# Patient Record
Sex: Male | Born: 1985 | Race: White | Hispanic: No | Marital: Single | Smoking: Current every day smoker
Health system: Southern US, Community
[De-identification: ages and names within clinical notes are randomized; demographics above are authoritative.]

---

## 2007-10-22 ENCOUNTER — Emergency Department: Payer: Self-pay | Admitting: Emergency Medicine

## 2009-12-25 ENCOUNTER — Emergency Department: Payer: Self-pay | Admitting: Emergency Medicine

## 2010-01-04 ENCOUNTER — Emergency Department: Payer: Self-pay | Admitting: Internal Medicine

## 2011-09-03 IMAGING — CT CT STONE STUDY
1 of 2 series · 16 of 32 positions shown, 20 images · non-contrast
Comparison: none

REASON FOR EXAM: hematuria, back pain
COMMENTS:

[Series 2: stone · axial · 0.85mm/px · z∈[-562,-100]mm · 16 of 168 slices shown, 20 images]
[im 7/168  soft-tissue]
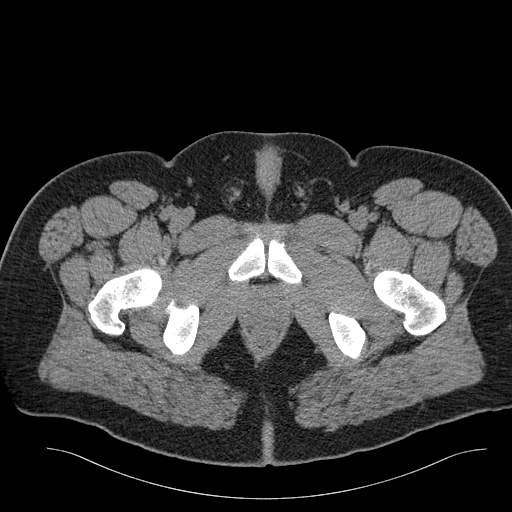
[im 7/168  bone]
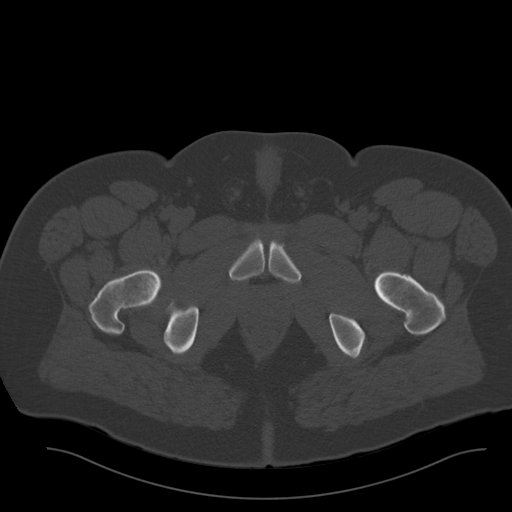
[im 21/168  soft-tissue]
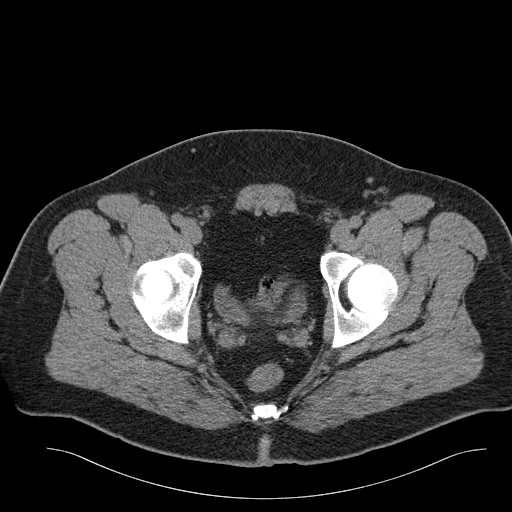
[im 35/168  soft-tissue]
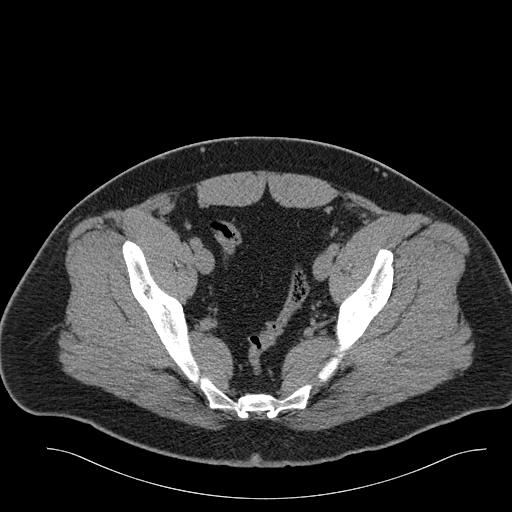
[im 42/168  soft-tissue]
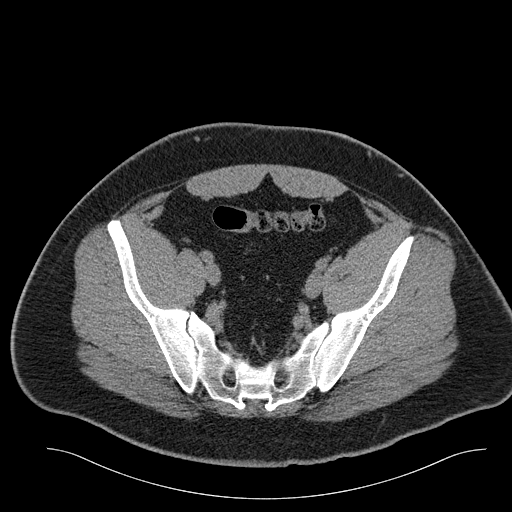
[im 56/168  soft-tissue]
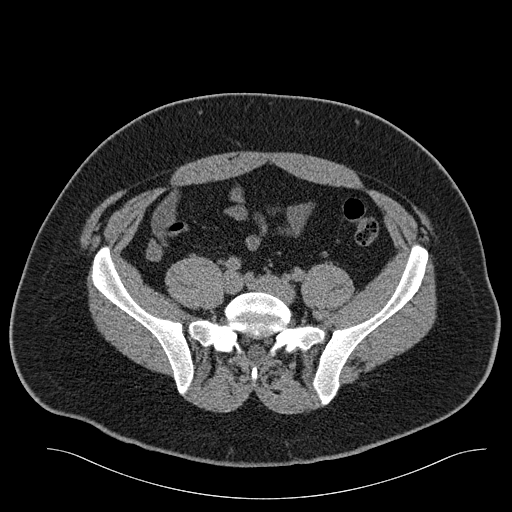
[im 70/168  soft-tissue]
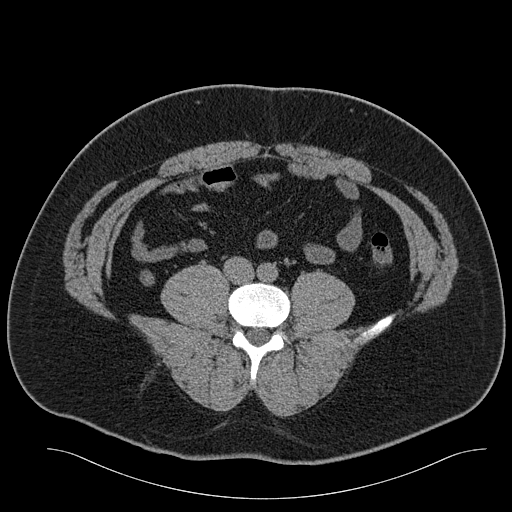
[im 77/168  soft-tissue]
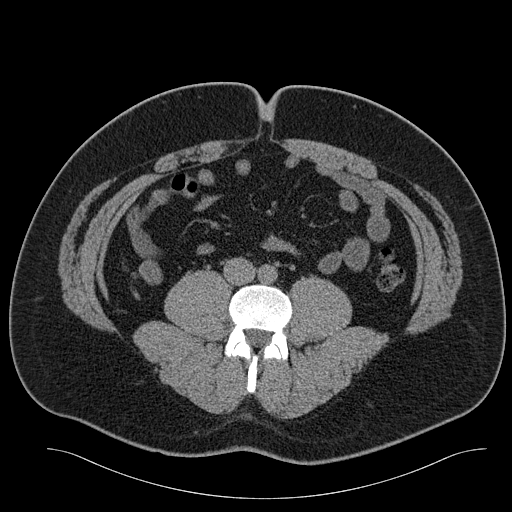
[im 91/168  soft-tissue]
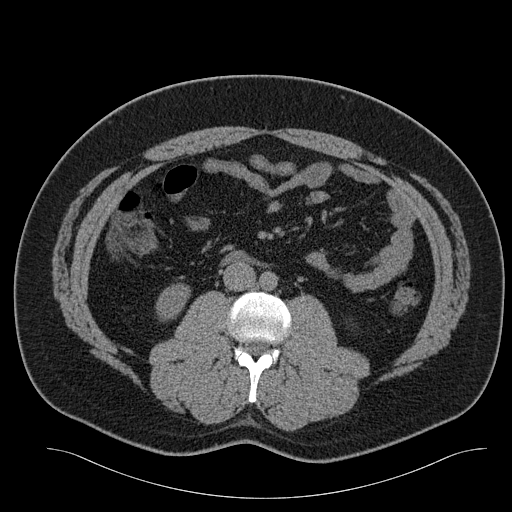
[im 98/168  soft-tissue]
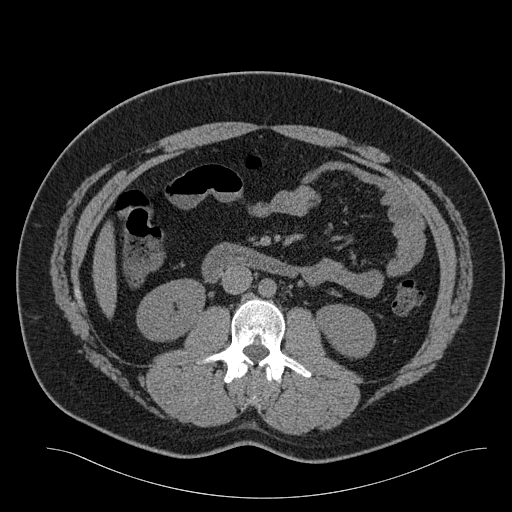
[im 98/168  bone]
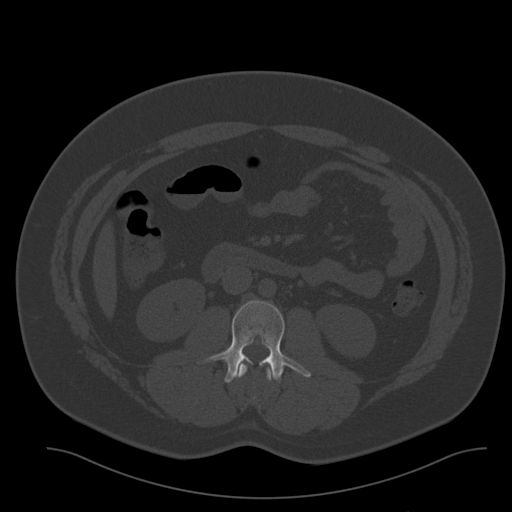
[im 112/168  soft-tissue]
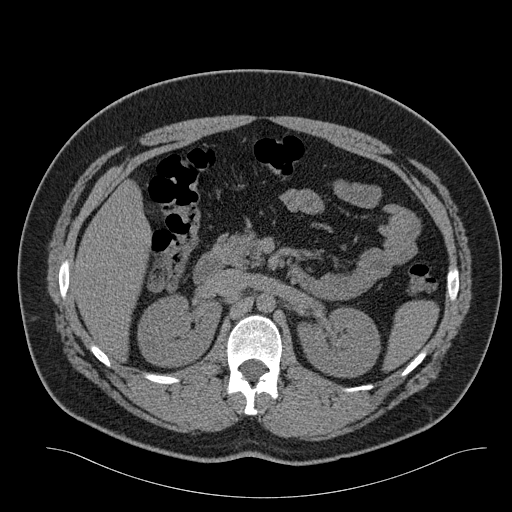
[im 126/168  soft-tissue]
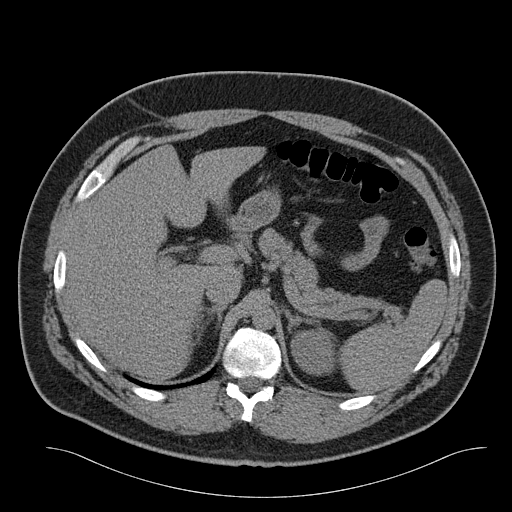
[im 133/168  soft-tissue]
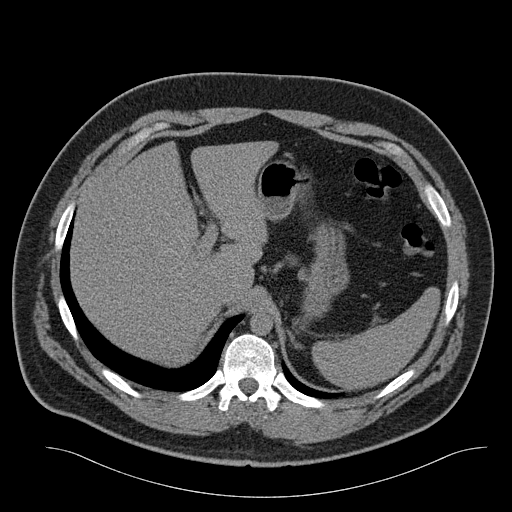
[im 140/168  lung]
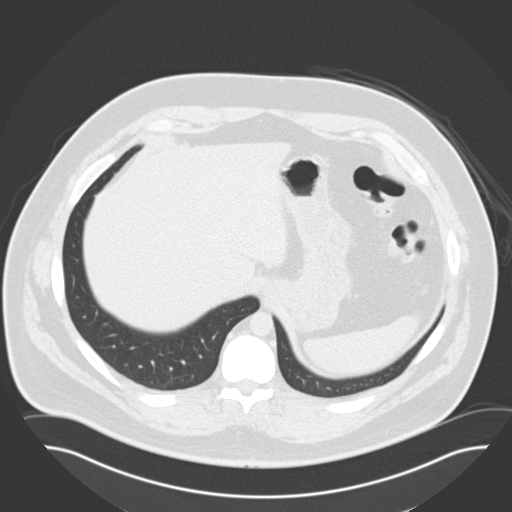
[im 147/168  soft-tissue]
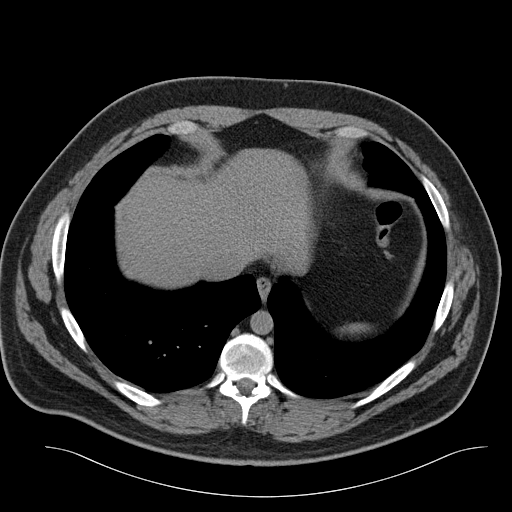
[im 147/168  lung]
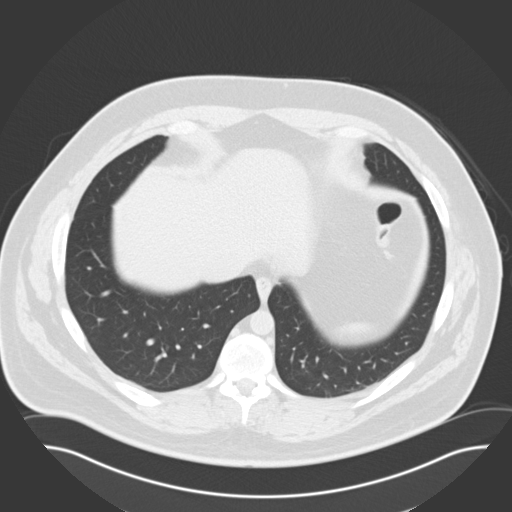
[im 154/168  lung]
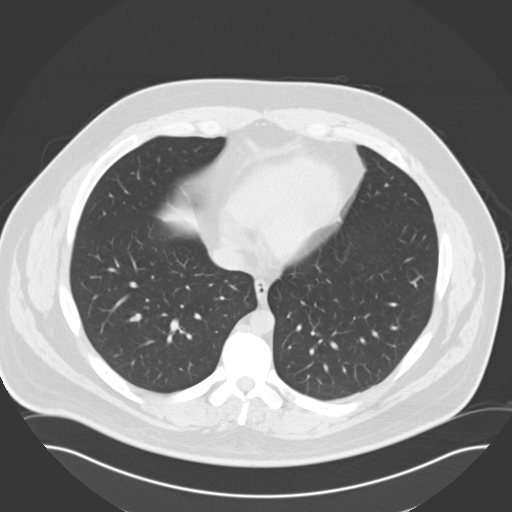
[im 161/168  soft-tissue]
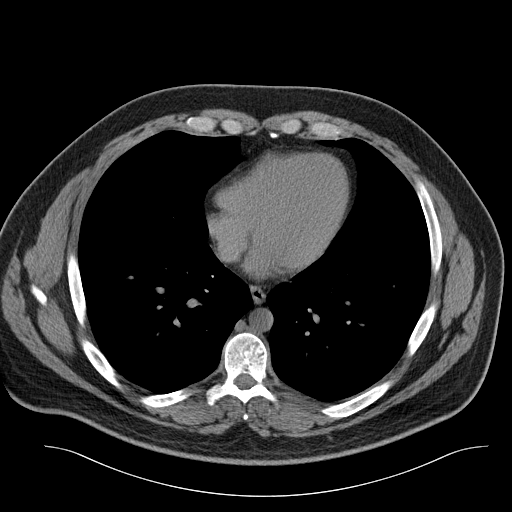
[im 161/168  lung]
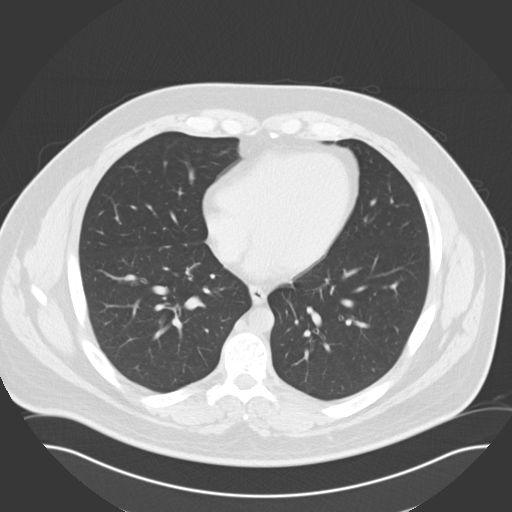

[16 of 32 positions shown; findings below may reference images not displayed]

PROCEDURE:     CT  - CT ABDOMEN /PELVIS WO (STONE)  - December 25, 2009  [DATE]

RESULT:     Renal stone protocol CT of the abdomen and pelvis is performed
in the standard fashion and reconstructed in the axial plane at 3 mm slice
thickness. There is no previous exam for comparison.

No renal calculi or obstructive changes are evident. The aorta is normal in
caliber. The appendix is normal. There is no acute inflammation. The upper
abdominal viscera appear normal. There is no free fluid, free air, bowel
wall thickening or abnormal bowel distention. The prostate and urinary
bladder appear unremarkable. Small phleboliths are seen in the pelvic region.
IMPRESSION: 1. Unremarkable CT of the abdomen and pelvis.

## 2014-05-31 HISTORY — PX: TONSILLECTOMY: SUR1361

## 2014-07-16 ENCOUNTER — Ambulatory Visit: Payer: Self-pay | Admitting: Unknown Physician Specialty

## 2014-09-23 LAB — SURGICAL PATHOLOGY

## 2014-09-29 NOTE — Op Note (Signed)
PATIENT NAME:  John [name] MR#:  [id number] DATE OF BIRTH:  1986-05-28  DATE OF PROCEDURE:  07/16/2014  PREOPERATIVE DIAGNOSIS: Nasal obstruction and chronic tonsillitis.  POSTOPERATIVE DIAGNOSIS: Nasal obstruction and chronic tonsillitis.  PROCEDURES PERFORMED: 1.  Nasal septoplasty. 2.  Bilateral submucous resection of inferior turbinates.  3.  Tonsillectomy.   SURGEON: Linus Salmons, MD  OPERATIVE FINDINGS: Left septal deviation, increased turbinate hypertrophy, large cryptic tonsils. No evidence of adenoid tissue.   DESCRIPTION OF THE PROCEDURE:  John [name] was identified in the holding area and taken to the operating room and placed in the supine position.  After general endotracheal anesthesia was induced, the table was turned 45 degrees and the patient was placed in a semi-Fowler position.  The nose was then topically anesthetized with Lidocaine, cotton pledgets were placed within each nostril. After approximately 5 minutes, this was removed at which time a local anesthetic of 1% Lidocaine 1:100,000 units of Epinephrine was used to inject the inferior turbinates in the nasal septum. A total of 12 ml was used. Examination of the nose showed a severe left nasal septal deformity and tremendous hypertrophied inferior turbinate.  Beginning on the right hand side a hemitransfixion incision was then created on the leading edge of the septum on the right.  A subperichondrial plane was elevated posteriorly on the left and taken back to the perpendicular plate of the ethmoid where subperiosteal plane was elevated posteriorly on the left. A large septal spur was identified on the left-hand side impacting on the inferior turbinate.  An inferior rim of cartilage was removed anteriorly with care taken to leave an anterior strut to prevent nasal collapse. With this strut removed the perpendicular plate of the ethmoid was separated from the quadrangular cartilage. The large septal spur was removed.  The  septum was then replaced in the midline. Reinspection through each nostril showed excellent reduction of the septal deformity. A left posterior inferior fenestration was then created to allow hematoma drainage.  With the septoplasty completed, beginning on the left-hand side, a 15 blade was used to incise along the inferior edge of the inferior turbinate. A superior laterally based flap was then elevated. The underlying conchal bone of mucosa was excised using Knight scissors. The flap was then laid back over the turbinate stump and cauterized using suction cautery. In a similar fashion the submucous resection was performed on the right.  With the submucous resection completed bilaterally and no active bleeding, the hemitransfixion incision was then closed using two interrupted 3-0 chromic sutures.  Plastic nasal septal splints were placed within each nostril and affixed to the septum using a 3-0 nylon suture. Stammberger was then used beneath each inferior turbinate for hemostasis.    With this completed, the patient's head was laid flat. A mouth gag was inserted in the oral cavity. A red rubber catheter was placed through the nostril. Examination of the nasopharynx showed no evidence of adenoid tissue. The operation then proceeded with tonsillectomy. Beginning on the left-hand side, a tenaculum was used to grasp the tonsil and the Bovie cautery was used to dissect it free from the fossa. Meticulous hemostasis was achieved using Bovie cautery. In a similar fashion, the right tonsil was removed. With both tonsils removed, and no active bleeding, 0.5% plain Marcaine was used to inject both tonsillar fossae. A total of 8 mL was used.   The patient was then returned to anesthesia where he was awakened in the operating room and taken to the recovery room in  stable condition.   CULTURES: None.   SPECIMENS: Tonsils.   ESTIMATED BLOOD LOSS: Less than 20 mL. ____________________________ John [name]. Latavion Halls,  MD ctm:sb D: 07/16/2014 08:30:42 ET [name]: 07/16/2014 10:32:45 ET JOB#: [id number]  cc: John [name]. Kaylyn Garrow, MD, <Dictator> John [name] Tahira Olivarez MD ELECTRONICALLY SIGNED 07/26/2014 8:17

## 2015-09-02 ENCOUNTER — Ambulatory Visit: Payer: Self-pay | Admitting: Family Medicine

## 2015-09-08 ENCOUNTER — Ambulatory Visit (INDEPENDENT_AMBULATORY_CARE_PROVIDER_SITE_OTHER): Payer: BLUE CROSS/BLUE SHIELD | Admitting: Family Medicine

## 2015-09-08 ENCOUNTER — Encounter: Payer: Self-pay | Admitting: Family Medicine

## 2015-09-08 VITALS — BP 124/78 | HR 87 | Temp 97.1°F | Ht 73.0 in | Wt 299.6 lb

## 2015-09-08 DIAGNOSIS — R0789 Other chest pain: Secondary | ICD-10-CM | POA: Diagnosis not present

## 2015-09-08 DIAGNOSIS — F418 Other specified anxiety disorders: Secondary | ICD-10-CM | POA: Diagnosis not present

## 2015-09-08 DIAGNOSIS — Z720 Tobacco use: Secondary | ICD-10-CM | POA: Diagnosis not present

## 2015-09-08 DIAGNOSIS — Z114 Encounter for screening for human immunodeficiency virus [HIV]: Secondary | ICD-10-CM

## 2015-09-08 DIAGNOSIS — F329 Major depressive disorder, single episode, unspecified: Secondary | ICD-10-CM | POA: Insufficient documentation

## 2015-09-08 DIAGNOSIS — F419 Anxiety disorder, unspecified: Secondary | ICD-10-CM

## 2015-09-08 LAB — LIPID PANEL
Cholesterol: 196 mg/dL (ref 0–200)
HDL: 38.4 mg/dL — ABNORMAL LOW (ref >39.00)
LDL Cholesterol: 134 mg/dL — ABNORMAL HIGH (ref 0–99)
NONHDL: 157.93
Total CHOL/HDL Ratio: 5
Triglycerides: 121 mg/dL (ref 0.0–149.0)
VLDL: 24.2 mg/dL (ref 0.0–40.0)

## 2015-09-08 LAB — TSH: TSH: 1.11 u[IU]/mL (ref 0.35–4.50)

## 2015-09-08 LAB — COMPREHENSIVE METABOLIC PANEL
ALK PHOS: 49 U/L (ref 39–117)
ALT: 64 U/L — ABNORMAL HIGH (ref 0–53)
AST: 29 U/L (ref 0–37)
Albumin: 4.6 g/dL (ref 3.5–5.2)
BUN: 10 mg/dL (ref 6–23)
CO2: 27 mEq/L (ref 19–32)
Calcium: 9.9 mg/dL (ref 8.4–10.5)
Chloride: 104 mEq/L (ref 96–112)
Creatinine, Ser: 0.82 mg/dL (ref 0.40–1.50)
GFR: 115.94 mL/min (ref >60.00)
GLUCOSE: 79 mg/dL (ref 70–99)
POTASSIUM: 4.3 meq/L (ref 3.5–5.1)
SODIUM: 139 meq/L (ref 135–145)
TOTAL PROTEIN: 7.3 g/dL (ref 6.0–8.3)
Total Bilirubin: 0.6 mg/dL (ref 0.2–1.2)

## 2015-09-08 LAB — CBC
HCT: 48.7 % (ref 39.0–52.0)
Hemoglobin: 16.7 g/dL (ref 13.0–17.0)
MCHC: 34.3 g/dL (ref 30.0–36.0)
MCV: 93.9 fl (ref 78.0–100.0)
PLATELETS: 253 10*3/uL (ref 150.0–400.0)
RBC: 5.19 Mil/uL (ref 4.22–5.81)
RDW: 14.1 % (ref 11.5–15.5)
WBC: 8.5 10*3/uL (ref 4.0–10.5)

## 2015-09-08 LAB — HEMOGLOBIN A1C: Hgb A1c MFr Bld: 5.6 % (ref 4.6–6.5)

## 2015-09-08 LAB — HIV ANTIBODY (ROUTINE TESTING W REFLEX): HIV 1&2 Ab, 4th Generation: NONREACTIVE

## 2015-09-08 MED ORDER — BUPROPION HCL ER (SR) 150 MG PO TB12: ORAL_TABLET | ORAL | Status: DC

## 2015-09-08 MED ORDER — CLONAZEPAM 0.5 MG PO TABS: 0.2500 mg | ORAL_TABLET | Freq: Two times a day (BID) | ORAL | Status: DC | PRN

## 2015-09-08 NOTE — Assessment & Plan Note (Signed)
Patient is interested in quitting smoking. Has previously had success with Chantix. Has tried nicotine supplementation as well the past. Given that he complains of anxiety and depression I discussed using Wellbutrin to cover for smoking cessation as well. Patient was willing to do this. We'll start on Wellbutrin.

## 2015-09-08 NOTE — Assessment & Plan Note (Addendum)
Patient with significant anxiety and depression surrounding his recent course. No SI or HI. Given his desire to quit smoking we will start him on Wellbutrin for depression and help with this. We will additionally treat with as needed Klonopin. Patient is in the process of seeking a therapist and informed me that the therapist that I typically refer people to is retiring. Patient has a list of therapists covered through his work and will contact them to set up an appointment.. Given return precautions.

## 2015-09-08 NOTE — Progress Notes (Signed)
Patient ID: John [name], male   DOB: [birth year], 30 y.o.   MRN: [id number]  John Rumps, MD Phone: [phone]  John [name] is a 30 y.o. male who presents today for new patient visit.  Patient reports over the last 8-9 months he has had significant anxiety and depression. He's been going through divorce. Life stressors have just been piling up. Denies SI and HI. Did go through marriage counseling. Has never seen a therapist previously. He notes about 2 weeks ago he did have some chest tightness while he was driving. He notes his hands felt tingly and he had some mild shortness of breath. No diaphoresis. Notes he had EMS come out to evaluate him and they helped calm him down. Reports they did not do an EKG. He thinks this was a panic attack. The chest pain was central and felt like something weighing on his chest. Is not exertional. No history of blood clot. No personal history of cardiac issues. He has no known blood pressure issues, diabetes, or hyperlipidemia. His father and grandfather both had CABGs in their late 66s and early 36s.  Active Ambulatory Problems    Diagnosis Date Noted  . Chest tightness 09/08/2015  . Anxiety and depression 09/08/2015  . Tobacco abuse 09/08/2015   Resolved Ambulatory Problems    Diagnosis Date Noted  . No Resolved Ambulatory Problems   No Additional Past Medical History    Family History  Problem Relation Age of Onset  . Heart disease Father     Late 74s  . Heart disease Paternal Grandfather     Early 29s    Social History   Social History  . Marital Status: Single    Spouse Name: N/A  . Number of Children: N/A  . Years of Education: N/A   Occupational History  . Not on file.   Social History Main Topics  . Smoking status: Current Every Day Smoker  . Smokeless tobacco: Not on file  . Alcohol Use: 0.6 oz/week    1 Standard drinks or equivalent per week  . Drug Use: No  . Sexual Activity: Not on file   Other Topics Concern    . Not on file   Social History Narrative  . No narrative on file    ROS  General:  Negative for nexplained weight loss, fever Skin: Negative for new or changing mole, sore that won't heal HEENT: Negative for trouble hearing, trouble seeing, ringing in ears, mouth sores, hoarseness, change in voice, dysphagia. CV:  Positive for chest pain, dyspnea, negative edema, palpitations Resp: Negative for cough, hemoptysis GI: Negative for nausea, vomiting, diarrhea, constipation, abdominal pain, melena, hematochezia. GU: Negative for dysuria, incontinence, urinary hesitance, hematuria, vaginal or penile discharge, polyuria, sexual difficulty, lumps in testicle or breasts MSK: Negative for muscle cramps or aches, joint pain or swelling Neuro: Negative for headaches, weakness, numbness, dizziness, passing out/fainting Psych: Positive for anxiety, Negative for depression, memory problems  Objective  Physical Exam Filed Vitals:   09/08/15 1027  BP: 124/78  Pulse: 87  Temp: 97.1 F (36.2 C)    BP Readings from Last 3 Encounters:  09/08/15 124/78   Wt Readings from Last 3 Encounters:  09/08/15 299 lb 9.6 oz (135.898 kg)    Physical Exam  Constitutional: He is well-developed, well-nourished, and in no distress.  HENT:  Head: Normocephalic and atraumatic.  Right Ear: External ear normal.  Left Ear: External ear normal.  Mouth/Throat: Oropharynx is clear and moist. No oropharyngeal exudate.  Eyes: Conjunctivae are normal. Pupils are equal, round, and reactive to light.  Neck: Neck supple.  Cardiovascular: Normal rate, regular rhythm and normal heart sounds.  Exam reveals no gallop and no friction rub.   No murmur heard. Pulmonary/Chest: Effort normal and breath sounds normal. No respiratory distress. He has no wheezes. He has no rales.  Abdominal: Soft. Bowel sounds are normal. He exhibits no distension. There is no tenderness. There is no rebound and no guarding.  Musculoskeletal:  He exhibits no edema.  Lymphadenopathy:    He has no cervical adenopathy.  Neurological: He is alert. Gait normal.  Skin: Skin is warm and dry. He is not diaphoretic.  Psychiatric:  Mood anxious, affect mildly anxious   EKG: Normal sinus rhythm, rate 84, no ST or T-wave changes  Assessment/Plan:   Chest tightness Suspect this is likely related to a panic attack and anxiety. EKG was done and was reassuring. He has known risk factor of smoking. No known hyperlipidemia, hypertension, or diabetes. Does have family history of CAD though this was late in life. No exertional component. We'll check lab work as outlined below. Doubt cardiac cause given associated with significant anxiety. Discussed workup for cardiac cause though the patient and myself opted for treatment of his anxiety as this appears to be the most likely cause. If has recurrent issues with this despite treatment of his anxiety would consider cardiac workup. Patient is given return precautions.  Anxiety and depression Patient with significant anxiety and depression surrounding his recent course. No SI or HI. Given his desire to quit smoking we will start him on Wellbutrin for depression and help with this. We will additionally treat with as needed Klonopin. Patient is in the process of seeking a therapist and informed me that the therapist that I typically refer people to is retiring. Patient has a list of therapists covered through his work and will contact them to set up an appointment.. Given return precautions.  Tobacco abuse Patient is interested in quitting smoking. Has previously had success with Chantix. Has tried nicotine supplementation as well the past. Given that he complains of anxiety and depression I discussed using Wellbutrin to cover for smoking cessation as well. Patient was willing to do this. We'll start on Wellbutrin.    Orders Placed This Encounter  Procedures  . Lipid Profile  . Comp Met (CMET)  . TSH  .  CBC  . HgB A1c  . HIV antibody (with reflex)  . EKG 12-Lead    Meds ordered this encounter  Medications  . buPROPion (WELLBUTRIN SR) 150 MG 12 hr tablet    Sig: Daily by mouth for 3 days, then twice daily by mouth    Dispense:  60 tablet    Refill:  3  . clonazePAM (KLONOPIN) 0.5 MG tablet    Sig: Take 0.5 tablets (0.25 mg total) by mouth 2 (two) times daily as needed for anxiety.    Dispense:  20 tablet    Refill:  0     John Rumps, MD [place]

## 2015-09-08 NOTE — Progress Notes (Signed)
Pre visit review using our clinic review tool, if applicable. No additional management support is needed unless otherwise documented below in the visit note. 

## 2015-09-08 NOTE — Patient Instructions (Signed)
Nice to meet you. We will check lab work today. We will start you on Wellbutrin and Klonopin for anxiety and depression. If you develop worsening anxiety or depression, or develop thoughts of harming your self or others, or chest pain, shortness of breath, palpitations, or any new or changing symptoms please seek medical attention.

## 2015-09-08 NOTE — Assessment & Plan Note (Addendum)
Suspect this is likely related to a panic attack and anxiety. EKG was done and was reassuring. He has known risk factor of smoking. No known hyperlipidemia, hypertension, or diabetes. Does have family history of CAD though this was late in life. No exertional component. We'll check lab work as outlined below. Doubt cardiac cause given associated with significant anxiety. Discussed workup for cardiac cause though the patient and myself opted for treatment of his anxiety as this appears to be the most likely cause. If has recurrent issues with this despite treatment of his anxiety would consider cardiac workup. Patient is given return precautions.

## 2015-09-09 ENCOUNTER — Telehealth: Payer: Self-pay | Admitting: Family Medicine

## 2015-09-09 ENCOUNTER — Encounter: Payer: Self-pay | Admitting: Surgical

## 2015-09-09 NOTE — Telephone Encounter (Signed)
Please advise 

## 2015-09-09 NOTE — Telephone Encounter (Signed)
Pt saw Dr. Birdie SonsSonnenberg yesterday. Pt took last evening, one clonazePAM (KLONOPIN) 0.5 MG tablet and it knocked him out until lunch today, which made him miss work. Pt is requesting a note for his job because he doesn't want to get fired. Also, pt wants to know if  there is a different medication on the market that will not knock him out?

## 2015-09-09 NOTE — Telephone Encounter (Signed)
Notified patient to stop taking the Klonopin and he would send something else in when he gets back in office. I have put a letter up front for the patient to pick up excusing him from work on 09/09/15.

## 2015-09-10 NOTE — Telephone Encounter (Signed)
LM for patient to return call.

## 2015-09-10 NOTE — Telephone Encounter (Signed)
Notified patient of phone message. Patient stated that he will contact us with any problems.

## 2015-09-10 NOTE — Telephone Encounter (Signed)
Given response to Klonopin I would be hesitant to put him on another benzodiazepine as they have the same side effect profile. I would suggest starting on the Wellbutrin as we discussed and contacting a therapist. If he has worsening anxiety or panic attacks he should let us know and we will determine alternative treatment regimen.

## 2015-10-06 ENCOUNTER — Encounter: Payer: Self-pay | Admitting: *Deleted

## 2015-10-06 ENCOUNTER — Ambulatory Visit: Payer: BLUE CROSS/BLUE SHIELD | Admitting: Family Medicine

## 2015-10-06 DIAGNOSIS — Z0289 Encounter for other administrative examinations: Secondary | ICD-10-CM

## 2015-10-10 ENCOUNTER — Ambulatory Visit: Payer: BLUE CROSS/BLUE SHIELD | Admitting: Family Medicine

## 2015-10-10 DIAGNOSIS — Z0289 Encounter for other administrative examinations: Secondary | ICD-10-CM

## 2015-10-13 ENCOUNTER — Ambulatory Visit (INDEPENDENT_AMBULATORY_CARE_PROVIDER_SITE_OTHER): Payer: BLUE CROSS/BLUE SHIELD | Admitting: Family Medicine

## 2015-10-13 ENCOUNTER — Encounter: Payer: Self-pay | Admitting: Family Medicine

## 2015-10-13 VITALS — BP 122/84 | HR 78 | Temp 97.9°F | Ht 73.0 in | Wt 296.4 lb

## 2015-10-13 DIAGNOSIS — F418 Other specified anxiety disorders: Secondary | ICD-10-CM | POA: Diagnosis not present

## 2015-10-13 DIAGNOSIS — F419 Anxiety disorder, unspecified: Principal | ICD-10-CM

## 2015-10-13 DIAGNOSIS — F329 Major depressive disorder, single episode, unspecified: Secondary | ICD-10-CM

## 2015-10-13 MED ORDER — CLONAZEPAM 0.5 MG PO TABS: 0.2500 mg | ORAL_TABLET | Freq: Two times a day (BID) | ORAL | Status: DC | PRN

## 2015-10-13 NOTE — Assessment & Plan Note (Addendum)
Overall anxiety and depression is improved. Panic attacks may be more frequent. We'll continue Wellbutrin. Klonopin refilled to take as needed for panic attacks. We'll refer to psychology for therapy. Given return precautions.

## 2015-10-13 NOTE — Progress Notes (Signed)
Pre visit review using our clinic review tool, if applicable. No additional management support is needed unless otherwise documented below in the visit note. 

## 2015-10-13 NOTE — Patient Instructions (Signed)
Nice to see you. I'm glad you're doing better. We will continue the Wellbutrin. I have refilled your Klonopin. We will refer you to a therapist. If you do not hear anything about this in the next week please let us know. If you develop worsening anxiety or depression please let us know. If you develop thoughts of harming yourself or anyone else please seek medical attention immediately.

## 2015-10-13 NOTE — Progress Notes (Signed)
Patient ID: John [name], male   DOB: Sep 26, 1985, 30 y.o.   MRN: [id number]  Marikay AlarEric Riniyah Speich, MD Phone: [phone]  John [name] is a 30 y.o. male who presents today for follow-up.  Anxiety/depression: Patient notes this has been better. Wellbutrin has helped significantly. Did have some excessive drowsiness with Klonopin initially though has been able to tolerate it since then. Notes this helps when he has a panic attack. Notes having 2-4 panic attacks a week. Last one was on Thursday. It Came Nowhere. Was Unable, Himself down. Does Note on Friday He Could Not Stop Cleaning His House. Has Been Unable to Find a Therapist. No SI or HI.  PMH: Smoker  ROS see history of present illness  Objective  Physical Exam Filed Vitals:   10/13/15 0803  BP: 122/84  Pulse: 78  Temp: 97.9 F (36.6 C)    BP Readings from Last 3 Encounters:  10/13/15 122/84  09/08/15 124/78   Wt Readings from Last 3 Encounters:  10/13/15 296 lb 6.4 oz (134.446 kg)  09/08/15 299 lb 9.6 oz (135.898 kg)    Physical Exam  Constitutional: He is well-developed, well-nourished, and in no distress.  HENT:  Head: Normocephalic and atraumatic.  Right Ear: External ear normal.  Left Ear: External ear normal.  Eyes: Conjunctivae are normal. Pupils are equal, round, and reactive to light.  Cardiovascular: Normal rate, regular rhythm and normal heart sounds.   Pulmonary/Chest: Effort normal and breath sounds normal.  Musculoskeletal: He exhibits no edema.  Neurological: He is alert. Gait normal.  Skin: Skin is warm and dry. He is not diaphoretic.  Psychiatric:  Mood depressed and anxious, affect mildly anxious     Assessment/Plan: Please see individual problem list.  Anxiety and depression Overall anxiety and depression is improved. Panic attacks may be more frequent. We'll continue Wellbutrin. Klonopin refilled to take as needed for panic attacks. We'll refer to psychology for therapy. Given return  precautions.    Orders Placed This Encounter  Procedures  . Ambulatory referral to Psychology    Referral Priority:  Routine    Referral Type:  Psychiatric    Referral Reason:  Specialty Services Required    Requested Specialty:  Psychology    Number of Visits Requested:  1    Meds ordered this encounter  Medications  . clonazePAM (KLONOPIN) 0.5 MG tablet    Sig: Take 0.5 tablets (0.25 mg total) by mouth 2 (two) times daily as needed for anxiety.    Dispense:  20 tablet    Refill:  0    Marikay AlarEric Zakhai Meisinger, MD [facility] [place]

## 2015-10-17 ENCOUNTER — Telehealth: Payer: Self-pay | Admitting: Family Medicine

## 2015-10-17 NOTE — Telephone Encounter (Signed)
Received disability papers from [employer] to be completed by Dr. Birdie SonsSonnenberg. Papers will be in Dr. Purvis SheffieldSonnenberg's box.

## 2015-10-20 ENCOUNTER — Telehealth: Payer: Self-pay | Admitting: *Deleted

## 2015-10-20 NOTE — Telephone Encounter (Signed)
FYI

## 2015-10-20 NOTE — Telephone Encounter (Signed)
Left message advising patient that clonazepam was sent to [facility] and he can call and request refill.

## 2015-10-20 NOTE — Telephone Encounter (Signed)
Patient has requested to have his clonazepam refilled. [facility] [address]

## 2015-10-22 ENCOUNTER — Other Ambulatory Visit: Payer: Self-pay

## 2015-10-22 NOTE — Telephone Encounter (Signed)
Patient received this initally at his OV with 20 Pills and he is down to 3 pills left, he is taking it as needed, but his anxiety has increased in the psat days.  Please advise a refill. thanks

## 2015-10-23 MED ORDER — CLONAZEPAM 0.5 MG PO TABS: 0.2500 mg | ORAL_TABLET | Freq: Two times a day (BID) | ORAL | Status: DC | PRN

## 2015-10-23 NOTE — Telephone Encounter (Signed)
Refill given

## 2015-10-28 NOTE — Telephone Encounter (Signed)
Spoke with patient and he stated that the pharmacy had neglected to tell him that RX had been sent in but he can't get it until the first of June. Stated he was sorry hadn't called us back but it is taken care of.

## 2015-10-28 NOTE — Telephone Encounter (Signed)
Pt. Stated that [facility] did not receive the request for the refill.

## 2015-10-29 ENCOUNTER — Ambulatory Visit (INDEPENDENT_AMBULATORY_CARE_PROVIDER_SITE_OTHER): Payer: BLUE CROSS/BLUE SHIELD | Admitting: Family Medicine

## 2015-10-29 ENCOUNTER — Encounter: Payer: Self-pay | Admitting: Family Medicine

## 2015-10-29 VITALS — BP 124/76 | HR 97 | Temp 98.4°F | Ht 73.0 in | Wt 298.0 lb

## 2015-10-29 DIAGNOSIS — R55 Syncope and collapse: Secondary | ICD-10-CM

## 2015-10-29 DIAGNOSIS — F418 Other specified anxiety disorders: Secondary | ICD-10-CM

## 2015-10-29 DIAGNOSIS — F329 Major depressive disorder, single episode, unspecified: Secondary | ICD-10-CM

## 2015-10-29 DIAGNOSIS — F419 Anxiety disorder, unspecified: Secondary | ICD-10-CM

## 2015-10-29 DIAGNOSIS — F32A Depression, unspecified: Secondary | ICD-10-CM

## 2015-10-29 MED ORDER — BUPROPION HCL ER (SR) 200 MG PO TB12: ORAL_TABLET | ORAL | Status: AC

## 2015-10-29 MED ORDER — CLONAZEPAM 0.5 MG PO TABS: 0.5000 mg | ORAL_TABLET | Freq: Two times a day (BID) | ORAL | Status: DC | PRN

## 2015-10-29 NOTE — Patient Instructions (Addendum)
Nice to see you. We will increase your Wellbutrin dose to 200 mg twice daily. Please continue the Klonopin as needed. Please check with your insurance company to see how much of cost to see a cardiologist for syncope. He can also check with them to see about the cost of an echocardiogram and carotid Dopplers for workup of syncope. If you develop chest pain, shortness of breath, palpitations, further syncopal episodes, thoughts of harming herself or others, or any new or changing symptoms please seek medical attention.

## 2015-10-29 NOTE — Progress Notes (Signed)
Pre visit review using our clinic review tool, if applicable. No additional management support is needed unless otherwise documented below in the visit note. 

## 2015-10-29 NOTE — Assessment & Plan Note (Signed)
Patient with 2 episodes of syncope in the last several weeks during panic attacks. 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 It is worrisome that this has been recurrent. No apparent seizure activity. EKG repeated today with no apparent abnormalities. I advised cardiology referral for further evaluation to ensure that this is not related to cardiac cause, the patient is unsure of this and wanted to check with his insurance company first. I also discussed obtaining an echo and carotid Dopplers to work it up further though he wanted to check with his insurance company first as well. We will proceed with treatment of his anxiety and depression as outlined in the anxiety and depression problem. He'll continue to monitor. He is advised if this recurs he needs to be evaluated immediately. He is given return precautions.

## 2015-10-29 NOTE — Assessment & Plan Note (Addendum)
Worsened recently. Panic attacks have gotten worse. We will increase Wellbutrin to 200 mg twice daily. Continue Klonopin as needed. Continue to see his therapist. Given return precautions.

## 2015-10-29 NOTE — Progress Notes (Signed)
Patient ID: John [name], male   DOB: 08-09-1985, 30 y.o.   MRN: [id number]  John Alar, MD Phone: [phone]  [name] is a 30 y.o. male who presents today for follow-up.  Patient notes the panic attacks have gotten worse. They're more frequent. Notes anxiety has gotten worse and depression has gotten worse. Has been taking Wellbutrin twice daily. Klonopin taking occasionally. Has been taking a whole tablet instead of half a tablet. No SI or HI. Patient does note with 2 of his panic attacks he has passed out. He lives alone so nobody witnessed this. First time was 2 weeks ago and he was out for 30-45 minutes. Last occurrence last Friday was out for 15 minutes. He has no post ictal phase. No incontinence. States it felt like a normal panic attack and that he couldn't feel his fingers bilaterally, had palpitations, and had sweats. Occasional chest pain with this. And then notes he was out. He started seeing a therapist yesterday and they think it is situational anxiety.  PMH: Former smoker   ROS see history of present illness  Objective  Physical Exam Filed Vitals:   10/29/15 0901  BP: 124/76  Pulse: 97  Temp: 98.4 F (36.9 C)    BP Readings from Last 3 Encounters:  10/29/15 124/76  10/13/15 122/84  09/08/15 124/78   Wt Readings from Last 3 Encounters:  10/29/15 298 lb (135.172 kg)  10/13/15 296 lb 6.4 oz (134.446 kg)  09/08/15 299 lb 9.6 oz (135.898 kg)    Physical Exam  Constitutional: He is well-developed, well-nourished, and in no distress.  HENT:  Head: Normocephalic and atraumatic.  Right Ear: External ear normal.  Left Ear: External ear normal.  Mouth/Throat: Oropharynx is clear and moist. No oropharyngeal exudate.  Eyes: Conjunctivae are normal. Pupils are equal, round, and reactive to light.  Neck: Neck supple.  Cardiovascular: Normal rate, regular rhythm and normal heart sounds.   No carotid bruits  Pulmonary/Chest: Effort normal and breath  sounds normal.  Lymphadenopathy:    He has no cervical adenopathy.  Neurological: He is alert.  CN 2-12 intact, 5/5 strength in bilateral biceps, triceps, grip, quads, hamstrings, plantar and dorsiflexion, sensation to light touch intact in bilateral UE and LE, normal gait, absent patellar reflexes  Skin: Skin is warm and dry. He is not diaphoretic.  Psychiatric:  Mood anxious, affect anxious   EKG: Normal sinus rhythm, rate 91, early repolarization noted in V2 and V3  Assessment/Plan: Please see individual problem list.  Anxiety and depression Worsened recently. Panic attacks have gotten worse. We will increase Wellbutrin to 200 mg twice daily. Continue Klonopin as needed. Continue to see his therapist. Given return precautions.  Syncope Patient with 2 episodes of syncope in the last several weeks during panic attacks. 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 It is worrisome that this has been recurrent. No apparent seizure activity. EKG repeated today with no apparent abnormalities. I advised cardiology referral for further evaluation to ensure that this is not related to cardiac cause, the patient is unsure of this and wanted to check with his insurance company first. I also discussed obtaining an echo and carotid Dopplers to work it up further though he wanted to check with his insurance company first as well. We will proceed with treatment of his anxiety and depression as outlined in the anxiety and depression problem. He'll continue to monitor. He is advised if this recurs he needs to be evaluated immediately. He is  given return precautions.    Orders Placed This Encounter  Procedures  . EKG 12-Lead    Meds ordered this encounter  Medications  . buPROPion (WELLBUTRIN SR) 200 MG 12 hr tablet    Sig: Daily by mouth for 3 days, then twice daily by mouth    Dispense:  60 tablet    Refill:  3  . clonazePAM (KLONOPIN) 0.5 MG tablet    Sig: Take 1 tablet (0.5 mg total) by  mouth 2 (two) times daily as needed for anxiety.    Dispense:  60 tablet    Refill:  0   John AlarEric Sonnenberg, MD [facility] [place]

## 2015-11-03 ENCOUNTER — Telehealth: Payer: Self-pay | Admitting: Family Medicine

## 2015-11-03 NOTE — Telephone Encounter (Signed)
Patient requested that Dr. Caryl Bis add a note that he was out of the office this week, the forms are due this week, however if the provider noted his absence [employer] will accept the forms being late.

## 2015-11-03 NOTE — Telephone Encounter (Signed)
Pt dropped of Short Term Disability papers to be signed by Dr. Birdie SonsSonnenberg. Will place papers in Dr. Purvis SheffieldSonnenberg's box.

## 2015-11-03 NOTE — Telephone Encounter (Signed)
Will give forms to Dr. Birdie SonsSonnenberg Monday

## 2015-11-10 NOTE — Telephone Encounter (Signed)
Forms on your desk.

## 2015-11-11 DIAGNOSIS — Z7689 Persons encountering health services in other specified circumstances: Secondary | ICD-10-CM

## 2015-11-11 NOTE — Telephone Encounter (Signed)
Form completed and placed on [name]'s desk. 

## 2015-11-12 NOTE — Telephone Encounter (Signed)
Formed faxed

## 2015-11-23 ENCOUNTER — Other Ambulatory Visit
Admission: RE | Admit: 2015-11-23 | Discharge: 2015-11-23 | Disposition: A | Discharge status: Home / Self Care | Attending: Family Medicine | Admitting: Family Medicine

## 2015-11-23 NOTE — ED Notes (Signed)
Pt brought to ED by [employer] officer  John [name] for blood draw. Pt blood drawn by this RN from the left ac x 1 attempt at 0125 am after cleansing area with provided cleaning pad from blood draw kit. Blood given to [employer] officer   per chain of custody protocol. Blood was drawn under search warrant.

## 2015-11-24 ENCOUNTER — Ambulatory Visit: Payer: BLUE CROSS/BLUE SHIELD | Admitting: Family Medicine

## 2015-11-24 DIAGNOSIS — Z0289 Encounter for other administrative examinations: Secondary | ICD-10-CM

## 2015-12-01 ENCOUNTER — Ambulatory Visit: Payer: BLUE CROSS/BLUE SHIELD | Admitting: Family Medicine

## 2015-12-12 ENCOUNTER — Telehealth: Payer: Self-pay | Admitting: Family Medicine

## 2015-12-12 MED ORDER — CLONAZEPAM 0.5 MG PO TABS: 0.5000 mg | ORAL_TABLET | Freq: Two times a day (BID) | ORAL | Status: DC | PRN

## 2015-12-12 NOTE — Telephone Encounter (Signed)
Last refilled on 10/29/15, please advise? Thanks

## 2015-12-12 NOTE — Telephone Encounter (Signed)
Patient would like a refill on his Clonazepam medication

## 2015-12-12 NOTE — Telephone Encounter (Signed)
Please fax refill to his pharmacy. Please also inform the patient that he needs to come to his next follow-up in order to receive future refills as he has missed several appointments. Thanks.

## 2016-01-13 ENCOUNTER — Ambulatory Visit: Payer: BLUE CROSS/BLUE SHIELD | Admitting: Family Medicine

## 2016-01-26 NOTE — Telephone Encounter (Deleted)
John [name] can you close this encounter.  It will not let me do it and it's on my que.

## 2016-02-10 ENCOUNTER — Encounter: Payer: Self-pay | Admitting: Family

## 2016-02-10 ENCOUNTER — Ambulatory Visit: Payer: BLUE CROSS/BLUE SHIELD | Admitting: Family

## 2016-02-10 ENCOUNTER — Ambulatory Visit (INDEPENDENT_AMBULATORY_CARE_PROVIDER_SITE_OTHER): Payer: BLUE CROSS/BLUE SHIELD | Admitting: Family

## 2016-02-10 VITALS — BP 138/86 | HR 97 | Temp 98.1°F | Ht 72.0 in | Wt 304.5 lb

## 2016-02-10 DIAGNOSIS — R197 Diarrhea, unspecified: Secondary | ICD-10-CM | POA: Diagnosis not present

## 2016-02-10 NOTE — Patient Instructions (Signed)
Suspect GI virus causing diarrhea and vomiting. I'm very reassured that you are slightly better today. Work note provided. Continue conservative therapy and ensure that you are drinking plenty of water. Bland diet; advance as tolerated.  If there is no improvement in your symptoms, or if there is any worsening of symptoms, or if you have any additional concerns, please return for re-evaluation; or, if we are closed, consider going to the Emergency Room for evaluation if symptoms urgent.

## 2016-02-10 NOTE — Progress Notes (Signed)
Subjective:    Patient ID: John [name], male    DOB: 1986-01-04, 30 y.o.   MRN: [id number]  CC: John [name] is a 30 y.o. male who presents today for an acute visit.    HPI: Patient here for acute visit for diarrhea and vomiting since this morning.  Young daughter had diarrhea which started two days ago - she goes to preschool. Last ate at 7am and then started vomiting, undigested food. No severe abdominal pain. Describes stomach as generalized crampy and in 'in knots.' No blood in emesis or stool. Very little to drink. Endorses a little lightheaded. Not dizzy, no syncope. Hasn't taken any medication. No history of diverticulitis, appendicitis.       HISTORY:  History reviewed. No pertinent past medical history. Past Surgical History:  Procedure Laterality Date  . TONSILLECTOMY  2016   Family History  Problem Relation Age of Onset  . Heart disease Father     Late 26s  . Heart disease Paternal Grandfather     Early 71s    Allergies: Review of patient's allergies indicates no known allergies. Current Outpatient Prescriptions on File Prior to Visit  Medication Sig Dispense Refill  . buPROPion (WELLBUTRIN SR) 200 MG 12 hr tablet Daily by mouth for 3 days, then twice daily by mouth 60 tablet 3  . clonazePAM (KLONOPIN) 0.5 MG tablet Take 1 tablet (0.5 mg total) by mouth 2 (two) times daily as needed for anxiety. 60 tablet 0   No current facility-administered medications on file prior to visit.     Social History  Substance Use Topics  . Smoking status: Current Every Day Smoker  . Smokeless tobacco: Never Used  . Alcohol use 0.6 oz/week    1 Standard drinks or equivalent per week    Review of Systems  Constitutional: Negative for chills and fever.  Respiratory: Negative for cough.   Cardiovascular: Negative for chest pain and palpitations.  Gastrointestinal: Negative for nausea and vomiting.      Objective:    BP 138/86   Pulse 97   Temp 98.1 F (36.7 C)  (Oral)   Ht 6' (1.829 m)   Wt (!) 304 lb 8 oz (138.1 kg)   SpO2 95%   BMI 41.30 kg/m    Physical Exam  Constitutional: He appears well-developed and well-nourished.  Cardiovascular: Regular rhythm and normal heart sounds.   Pulmonary/Chest: Effort normal and breath sounds normal. No respiratory distress. He has no wheezes. He has no rhonchi. He has no rales.  Abdominal: Soft. Normal appearance and bowel sounds are normal. He exhibits no distension, no fluid wave, no ascites and no mass. There is generalized tenderness. There is no rigidity, no rebound, no guarding, no tenderness at McBurney's point and negative Murphy's sign.  Generalized tenderness. No focal tenderness.   Neurological: He is alert.  Skin: Skin is warm and dry.  Psychiatric: He has a normal mood and affect. His speech is normal and behavior is normal.  Vitals reviewed.      Assessment & Plan:  1. Diarrhea, unspecified type Working diagnosis of viral gastritis likely due to contact from daughter. I'm very reassured the patient is feeling better this afternoon. Afebrile. Vitals signs normal. Patient and I jointly agreed on conservative therapy at this time. Encouraged fluids and to advance diet as tolerated. Return precautions given.     I am having John [name] maintain his buPROPion and clonazePAM.   No orders of the defined types were placed in  this encounter.   Return precautions given.   Risks, benefits, and alternatives of the medications and treatment plan prescribed today were discussed, and patient expressed understanding.   Education regarding symptom management and diagnosis given to patient on AVS.  Continue to follow with Marikay AlarEric Sonnenberg, MD for routine health maintenance.   Bonner PunaJoshua P Biancardi and I agreed with plan.   Rennie PlowmanMargaret Enolia Koepke, FNP

## 2016-02-10 NOTE — Progress Notes (Signed)
Pre visit review using our clinic review tool, if applicable. No additional management support is needed unless otherwise documented below in the visit note. 

## 2016-02-12 ENCOUNTER — Telehealth: Payer: Self-pay | Admitting: Family Medicine

## 2016-02-12 ENCOUNTER — Other Ambulatory Visit: Payer: Self-pay | Admitting: *Deleted

## 2016-02-12 NOTE — Telephone Encounter (Signed)
Patient has requested a medication refill for clonazepam  Pharmacy [facility]

## 2016-02-12 NOTE — Telephone Encounter (Signed)
Last OV was an acute one with NP, last refill was 7/14 #60 with no refills.  Please advise, thanks

## 2016-02-12 NOTE — Telephone Encounter (Signed)
Pt dropped off FMLA paperwork.. Placed in Dr. Purvis SheffieldSonnenberg's folder up front.. Please advise pt when finished

## 2016-02-12 NOTE — Telephone Encounter (Signed)
Dr Birdie SonsSonnenberg advised that John [name] fill this out since she was the person to see him for this problem.

## 2016-02-13 MED ORDER — CLONAZEPAM 0.5 MG PO TABS: 0.5000 mg | ORAL_TABLET | Freq: Two times a day (BID) | ORAL | 0 refills | Status: DC | PRN

## 2016-02-13 NOTE — Telephone Encounter (Signed)
Please advise, thanks.

## 2016-02-13 NOTE — Telephone Encounter (Signed)
Refill can be faxed to pharmacy. Patient needs an office visit scheduled to follow-up with me for this issue.

## 2016-02-16 ENCOUNTER — Telehealth: Payer: Self-pay | Admitting: Family Medicine

## 2016-02-16 NOTE — Telephone Encounter (Signed)
Pt lost/or it was stolen clonazePAM (KLONOPIN) 0.5 MG tablet. Pharmacy will not refill unless doctor authorizes refill. [phone].

## 2016-02-16 NOTE — Telephone Encounter (Signed)
Scheduled patient for follow up.

## 2016-02-18 NOTE — Telephone Encounter (Signed)
Patient has been notified.  Patient stated he will be paying for his appointment tomorrow on Friday when he gets paid.

## 2016-02-18 NOTE — Telephone Encounter (Signed)
FYI Paperwork is in blue folder.

## 2016-02-18 NOTE — Telephone Encounter (Signed)
Please fax paperwork,   Please call patient to let him know it has been done.   Let him know fee $20.00 for FMLA.

## 2016-02-19 ENCOUNTER — Ambulatory Visit: Payer: BLUE CROSS/BLUE SHIELD | Admitting: Family Medicine

## 2016-02-19 DIAGNOSIS — Z0289 Encounter for other administrative examinations: Secondary | ICD-10-CM

## 2016-02-23 ENCOUNTER — Ambulatory Visit: Payer: BLUE CROSS/BLUE SHIELD | Admitting: Family Medicine

## 2016-02-23 ENCOUNTER — Telehealth: Payer: Self-pay | Admitting: Surgical

## 2016-02-23 ENCOUNTER — Telehealth: Payer: Self-pay | Admitting: Family Medicine

## 2016-02-23 DIAGNOSIS — Z0289 Encounter for other administrative examinations: Secondary | ICD-10-CM

## 2016-02-23 NOTE — Telephone Encounter (Signed)
FYI, Pt called stating he forgot about his appt today. Pt made another appt. Let me know if you want me to cancel appt on the sch. Thank you!

## 2016-02-23 NOTE — Telephone Encounter (Signed)
Please start paperwork to discharge patient from our practice. I will forward to Bah[place]anya and Nancy.

## 2016-02-23 NOTE — Telephone Encounter (Signed)
Patient has No showed 6 times and canceled several appointments. Can we start discharge papers?

## 2016-02-24 NOTE — Telephone Encounter (Signed)
Ok. NP. Done!

## 2016-02-24 NOTE — Telephone Encounter (Signed)
Patient should be charged a no show fee and if he No shows again per Dr. Birdie SonsSonnenberg is not to be put back on the schedule.

## 2016-02-25 ENCOUNTER — Ambulatory Visit (INDEPENDENT_AMBULATORY_CARE_PROVIDER_SITE_OTHER): Payer: BLUE CROSS/BLUE SHIELD | Admitting: Family Medicine

## 2016-02-25 ENCOUNTER — Encounter: Payer: Self-pay | Admitting: Family Medicine

## 2016-02-25 DIAGNOSIS — G479 Sleep disorder, unspecified: Secondary | ICD-10-CM

## 2016-02-25 DIAGNOSIS — F419 Anxiety disorder, unspecified: Principal | ICD-10-CM

## 2016-02-25 DIAGNOSIS — F418 Other specified anxiety disorders: Secondary | ICD-10-CM | POA: Diagnosis not present

## 2016-02-25 DIAGNOSIS — F329 Major depressive disorder, single episode, unspecified: Secondary | ICD-10-CM

## 2016-02-25 DIAGNOSIS — F32A Depression, unspecified: Secondary | ICD-10-CM

## 2016-02-25 MED ORDER — CLONAZEPAM 0.5 MG PO TABS: 0.5000 mg | ORAL_TABLET | Freq: Every day | ORAL | 0 refills | Status: AC | PRN

## 2016-02-25 NOTE — Telephone Encounter (Signed)
Dismissal process started in HIM 

## 2016-02-25 NOTE — Progress Notes (Signed)
  John AlarEric Sonnenberg, MD Phone: [phone]  John [name] is a 30 y.o. male who presents today for follow-up.  Anxiety: Patient notes this is improved from previously. Taking Klonopin every other day now. Taking Wellbutrin. Feels better overall. Has a panic attack every 3 days. Has missed some work related to this. Has started using a breathing techniques. No depression. No SI. Klonopin does not make him drowsy.  Patient additionally reports some sleep issues. Notes some nights he just does not fall sleep at all. He reads for several hours and just doesn't feel tired. He drinks a significant amount of caffeinated beverages. Drinks either coffee or monster energy drinks. He does not know the last one that he drinks every day.   ROS see history of present illness  Objective  Physical Exam Vitals:   02/25/16 1315  BP: 118/76  Pulse: (!) 101  Temp: 98.6 F (37 C)    BP Readings from Last 3 Encounters:  02/25/16 118/76  02/10/16 138/86  10/29/15 124/76   Wt Readings from Last 3 Encounters:  02/25/16 (!) 300 lb 6 oz (136.2 kg)  02/10/16 (!) 304 lb 8 oz (138.1 kg)  10/29/15 298 lb (135.2 kg)    Physical Exam  Constitutional: He is well-developed, well-nourished, and in no distress.  Cardiovascular: Normal rate and normal heart sounds.   Pulmonary/Chest: Effort normal and breath sounds normal.  Neurological: He is alert. Gait normal.  Skin: Skin is warm and dry.  Psychiatric: Mood and affect normal.     Assessment/Plan: Please see individual problem list.  Anxiety and depression Mostly anxiety at this point. Doing well overall. Continue Wellbutrin. Klonopin refilled for 1 month. Discussed with the patient that he has had some issues with not showing up for appointments and canceling appointments the day of. I advised that if he continues to do this we will have to dismiss him from the practice. He voiced understanding. He is given return precautions.  Sleeping  difficulty Suspect this is related to caffeine intake. Could also be related to his anxiety. Discussed continued treatment of anxiety. Discussed decreasing his caffeine intake and seeing how he does.   No orders of the defined types were placed in this encounter.   Meds ordered this encounter  Medications  . clonazePAM (KLONOPIN) 0.5 MG tablet    Sig: Take 1 tablet (0.5 mg total) by mouth daily as needed for anxiety.    Dispense:  30 tablet    Refill:  0    John AlarEric Sonnenberg, MD [facility] [place]

## 2016-02-25 NOTE — Progress Notes (Signed)
Originally Paediatric nurseDismissal letter written, on hold at this point.  Drafted new letter with documentation of conversation today with PCP to inforce the no show/cancelled policy with the patient.  Gave to PCP to sign. thanks

## 2016-02-25 NOTE — Telephone Encounter (Signed)
This needs to be sent to the patient in letter form, notifying him of the discussion and agreement today, and future noncompliance resulting in dismissal.  There is probably a form letter.  I know Dr. Dan HumphreysWalker has done several of these.

## 2016-02-25 NOTE — Telephone Encounter (Signed)
Patient showed up for visit today. I discussed with him that we will dismiss him if he has another no show or cancellation the day of his appointment we will dismiss him. He stated he understood.

## 2016-02-25 NOTE — Assessment & Plan Note (Signed)
Suspect this is related to caffeine intake. Could also be related to his anxiety. Discussed continued treatment of anxiety. Discussed decreasing his caffeine intake and seeing how he does.

## 2016-02-25 NOTE — Telephone Encounter (Signed)
Agree with dismissal.

## 2016-02-25 NOTE — Patient Instructions (Signed)
Nice to see you. Reason refill your Klonopin. I will see you back in 2 months. If you develop depression, worsening anxiety, thoughts of harming yourself, or any new or changing symptoms please seek medical attention.

## 2016-02-25 NOTE — Telephone Encounter (Signed)
New letter drafted and signed by PCP, sent to patient, will follow

## 2016-02-25 NOTE — Assessment & Plan Note (Signed)
Mostly anxiety at this point. Doing well overall. Continue Wellbutrin. Klonopin refilled for 1 month. Discussed with the patient that he has had some issues with not showing up for appointments and canceling appointments the day of. I advised that if he continues to do this we will have to dismiss him from the practice. He voiced understanding. He is given return precautions.

## 2016-02-27 ENCOUNTER — Telehealth: Payer: Self-pay | Admitting: Family Medicine

## 2016-02-27 NOTE — Telephone Encounter (Signed)
Pt called and stated that he needs a refill of his clonazePAM (KLONOPIN) 0.5 MG tablet called into his pharmacy because they said that it is to early to refill and need the doctor's ok to refill it. Patient has no medication left. Thank you!  Call pt @ [phone]  Pharmacy - [id number] [place] - [address]

## 2016-02-27 NOTE — Telephone Encounter (Signed)
Spoke with pharmacy and advised per Dr. Birdie SonsSonnenberg that it is OK for patient to fill this RX. Had them to discontinue any other RX for the Klonopin that was on file. They stated that patient would have to pay cash for this.

## 2016-03-02 ENCOUNTER — Telehealth: Payer: Self-pay | Admitting: Family Medicine

## 2016-03-02 ENCOUNTER — Telehealth: Payer: Self-pay | Admitting: *Deleted

## 2016-03-02 NOTE — Telephone Encounter (Signed)
Pt came in to drop off FMLA paper work. It's in Red folder up front. [facility]. Thank you!

## 2016-03-02 NOTE — Telephone Encounter (Signed)
Spoke with patient and he stated that the [facility] should have been that he was out of work for the days that Claris CheMargaret put the patient out of work with the work note that is in the system. He needs fixed forms to be faxed to the same number as before. Please call the patient when this is done.

## 2016-03-02 NOTE — Telephone Encounter (Signed)
Pt stated that he has FMLA paperwork filled out by Dr.Sonenberg, however the form was filled out incorrectly. He requested it to be re-faxed and stated the fax number should be on the form. He will later drop off more FMLA paperwork to be filled out for his anxiety.  Pt contact [phone]

## 2016-03-03 NOTE — Telephone Encounter (Signed)
Form in red folder

## 2016-03-10 NOTE — Telephone Encounter (Signed)
Still have not received paperwork. John [name] is aware.

## 2016-03-11 ENCOUNTER — Ambulatory Visit: Payer: BLUE CROSS/BLUE SHIELD | Admitting: Family Medicine

## 2016-03-13 NOTE — Telephone Encounter (Signed)
Completed and placed on [name]'s desk. 

## 2016-03-15 NOTE — Telephone Encounter (Signed)
Forms faxed

## 2016-04-01 ENCOUNTER — Telehealth: Payer: Self-pay | Admitting: Family Medicine

## 2016-04-01 NOTE — Telephone Encounter (Signed)
Form in red folder

## 2016-04-01 NOTE — Telephone Encounter (Signed)
Pt dropped off a paper for work place accommodations to be completed by Dr. Birdie SonsSonnenberg. Paper work is up front in [employer] folder.

## 2016-04-05 DIAGNOSIS — Z7689 Persons encountering health services in other specified circumstances: Secondary | ICD-10-CM

## 2016-04-05 NOTE — Telephone Encounter (Signed)
I filled in as much as I can. Forms placed on your desk

## 2016-04-05 NOTE — Telephone Encounter (Signed)
Form completed. Given to [name].  

## 2016-04-05 NOTE — Telephone Encounter (Signed)
Pt called looking for those papers. Stated that they need to be turned in today. Please advise, thank you!  Call pt @ [phone]

## 2016-04-05 NOTE — Telephone Encounter (Signed)
Notified patient that forms will be placed up front for pick up

## 2016-04-05 NOTE — Telephone Encounter (Signed)
Please advise 

## 2016-04-05 NOTE — Telephone Encounter (Signed)
Pt called checking on the status of the paperwork.. Please advise

## 2016-04-26 ENCOUNTER — Ambulatory Visit: Payer: BLUE CROSS/BLUE SHIELD | Admitting: Family Medicine

## 2016-04-26 DIAGNOSIS — Z0289 Encounter for other administrative examinations: Secondary | ICD-10-CM

## 2016-04-28 ENCOUNTER — Telehealth: Payer: Self-pay | Admitting: Family Medicine

## 2016-04-28 NOTE — Telephone Encounter (Signed)
Patient dismissed from [facility] by Marikay AlarEric Sonnenberg MD , effective April 26, 2016. Dismissal letter sent out by certified / registered mail.  DAJ

## 2016-05-25 NOTE — Telephone Encounter (Signed)
Certified dismissal letter returned as undeliverable, unclaimed, return to sender after three attempts by [employer] on May 25, 2016 Letter placed in another envelope and resent as 1st class mail which does not require a signature. DAJ
